# Patient Record
Sex: Female | Born: 1950 | Race: White | Hispanic: No | Marital: Married | Smoking: Never smoker
Health system: Southern US, Community
[De-identification: ages and names within clinical notes are randomized; demographics above are authoritative.]

## PROBLEM LIST (undated history)

## (undated) DIAGNOSIS — E785 Hyperlipidemia, unspecified: Secondary | ICD-10-CM

## (undated) DIAGNOSIS — E119 Type 2 diabetes mellitus without complications: Secondary | ICD-10-CM

## (undated) DIAGNOSIS — I10 Essential (primary) hypertension: Secondary | ICD-10-CM

## (undated) DIAGNOSIS — F419 Anxiety disorder, unspecified: Secondary | ICD-10-CM

## (undated) HISTORY — DX: Type 2 diabetes mellitus without complications: E11.9

## (undated) HISTORY — DX: Hyperlipidemia, unspecified: E78.5

## (undated) HISTORY — PX: ABDOMINAL HYSTERECTOMY: SHX81

## (undated) HISTORY — DX: Essential (primary) hypertension: I10

## (undated) HISTORY — DX: Anxiety disorder, unspecified: F41.9

## (undated) HISTORY — PX: SPINE SURGERY: SHX786

---

## 1998-09-25 ENCOUNTER — Emergency Department (HOSPITAL_COMMUNITY): Admission: EM | Admit: 1998-09-25 | Discharge: 1998-09-25 | Payer: Self-pay | Admitting: Emergency Medicine

## 1999-04-02 ENCOUNTER — Other Ambulatory Visit: Admission: RE | Admit: 1999-04-02 | Discharge: 1999-04-02 | Payer: Self-pay | Admitting: Obstetrics & Gynecology

## 1999-04-26 ENCOUNTER — Emergency Department (HOSPITAL_COMMUNITY): Admission: EM | Admit: 1999-04-26 | Discharge: 1999-04-26 | Payer: Self-pay | Admitting: Emergency Medicine

## 1999-06-03 ENCOUNTER — Ambulatory Visit (HOSPITAL_COMMUNITY): Admission: RE | Admit: 1999-06-03 | Discharge: 1999-06-03 | Payer: Self-pay | Admitting: *Deleted

## 1999-08-26 ENCOUNTER — Encounter: Payer: Self-pay | Admitting: Emergency Medicine

## 1999-08-26 ENCOUNTER — Encounter: Admission: RE | Admit: 1999-08-26 | Discharge: 1999-08-26 | Payer: Self-pay | Admitting: Emergency Medicine

## 1999-10-06 ENCOUNTER — Encounter: Payer: Self-pay | Admitting: Emergency Medicine

## 1999-10-06 ENCOUNTER — Encounter: Admission: RE | Admit: 1999-10-06 | Discharge: 1999-10-06 | Payer: Self-pay | Admitting: Emergency Medicine

## 2000-05-28 ENCOUNTER — Other Ambulatory Visit: Admission: RE | Admit: 2000-05-28 | Discharge: 2000-05-28 | Payer: Self-pay | Admitting: Obstetrics & Gynecology

## 2000-07-28 ENCOUNTER — Ambulatory Visit (HOSPITAL_COMMUNITY): Admission: RE | Admit: 2000-07-28 | Discharge: 2000-07-28 | Payer: Self-pay | Admitting: Obstetrics & Gynecology

## 2000-07-28 ENCOUNTER — Encounter: Payer: Self-pay | Admitting: Obstetrics & Gynecology

## 2001-05-25 ENCOUNTER — Other Ambulatory Visit: Admission: RE | Admit: 2001-05-25 | Discharge: 2001-05-25 | Payer: Self-pay | Admitting: Obstetrics & Gynecology

## 2002-03-29 ENCOUNTER — Encounter: Payer: Self-pay | Admitting: Obstetrics & Gynecology

## 2002-03-29 ENCOUNTER — Ambulatory Visit (HOSPITAL_COMMUNITY): Admission: RE | Admit: 2002-03-29 | Discharge: 2002-03-29 | Payer: Self-pay | Admitting: Obstetrics & Gynecology

## 2002-05-26 ENCOUNTER — Other Ambulatory Visit: Admission: RE | Admit: 2002-05-26 | Discharge: 2002-05-26 | Payer: Self-pay | Admitting: Obstetrics & Gynecology

## 2003-10-17 ENCOUNTER — Inpatient Hospital Stay (HOSPITAL_COMMUNITY): Admission: EM | Admit: 2003-10-17 | Discharge: 2003-10-18 | Payer: Self-pay | Admitting: Emergency Medicine

## 2004-03-04 ENCOUNTER — Emergency Department (HOSPITAL_COMMUNITY): Admission: AC | Admit: 2004-03-04 | Discharge: 2004-03-04 | Payer: Self-pay

## 2004-03-06 ENCOUNTER — Encounter: Admission: RE | Admit: 2004-03-06 | Discharge: 2004-03-06 | Payer: Self-pay | Admitting: Emergency Medicine

## 2004-03-18 ENCOUNTER — Emergency Department (HOSPITAL_COMMUNITY): Admission: EM | Admit: 2004-03-18 | Discharge: 2004-03-19 | Payer: Self-pay | Admitting: Emergency Medicine

## 2004-04-14 ENCOUNTER — Emergency Department (HOSPITAL_COMMUNITY): Admission: EM | Admit: 2004-04-14 | Discharge: 2004-04-14 | Payer: Self-pay | Admitting: Emergency Medicine

## 2004-04-15 ENCOUNTER — Emergency Department (HOSPITAL_COMMUNITY): Admission: EM | Admit: 2004-04-15 | Discharge: 2004-04-15 | Payer: Self-pay | Admitting: Emergency Medicine
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
## 2005-08-27 ENCOUNTER — Encounter: Admission: RE | Admit: 2005-08-27 | Discharge: 2005-08-27 | Payer: Self-pay | Admitting: Emergency Medicine

## 2005-09-10 ENCOUNTER — Encounter: Admission: RE | Admit: 2005-09-10 | Discharge: 2005-09-10 | Payer: Self-pay | Admitting: Emergency Medicine

## 2005-09-24 ENCOUNTER — Encounter: Admission: RE | Admit: 2005-09-24 | Discharge: 2005-09-24 | Payer: Self-pay | Admitting: Emergency Medicine

## 2005-10-05 ENCOUNTER — Encounter: Admission: RE | Admit: 2005-10-05 | Discharge: 2005-10-05 | Payer: Self-pay | Admitting: Emergency Medicine

## 2006-04-07 ENCOUNTER — Encounter: Admission: RE | Admit: 2006-04-07 | Discharge: 2006-04-07 | Payer: Self-pay | Admitting: Emergency Medicine

## 2006-04-22 ENCOUNTER — Encounter: Admission: RE | Admit: 2006-04-22 | Discharge: 2006-04-22 | Payer: Self-pay | Admitting: Emergency Medicine

## 2006-05-13 ENCOUNTER — Encounter: Admission: RE | Admit: 2006-05-13 | Discharge: 2006-05-13 | Payer: Self-pay | Admitting: Emergency Medicine

## 2006-12-05 ENCOUNTER — Encounter: Admission: RE | Admit: 2006-12-05 | Discharge: 2006-12-05 | Payer: Self-pay | Admitting: Emergency Medicine

## 2007-04-04 ENCOUNTER — Ambulatory Visit (HOSPITAL_COMMUNITY): Admission: RE | Admit: 2007-04-04 | Discharge: 2007-04-04 | Payer: Self-pay | Admitting: Obstetrics & Gynecology

## 2007-05-14 ENCOUNTER — Encounter: Admission: RE | Admit: 2007-05-14 | Discharge: 2007-05-14 | Payer: Self-pay | Admitting: Emergency Medicine

## 2007-06-13 ENCOUNTER — Observation Stay (HOSPITAL_COMMUNITY): Admission: RE | Admit: 2007-06-13 | Discharge: 2007-06-14 | Payer: Self-pay | Admitting: Neurosurgery

## 2007-08-04 ENCOUNTER — Encounter: Admission: RE | Admit: 2007-08-04 | Discharge: 2007-08-04 | Payer: Self-pay | Admitting: Neurosurgery

## 2008-01-28 ENCOUNTER — Encounter: Admission: RE | Admit: 2008-01-28 | Discharge: 2008-01-28 | Payer: Self-pay | Admitting: Neurosurgery

## 2008-09-07 IMAGING — RF DG CERVICAL SPINE 1V
1 series · 1 of 1 positions shown · non-contrast
Comparison: none

CLINICAL DATA: 55 year old female; status post C5-6 ACDF.
 CERVICAL SPINE ? SINGLE VIEW ? 06/13/07:

[Series 1: run · 1 of 1 slices shown]
[im 1/1]
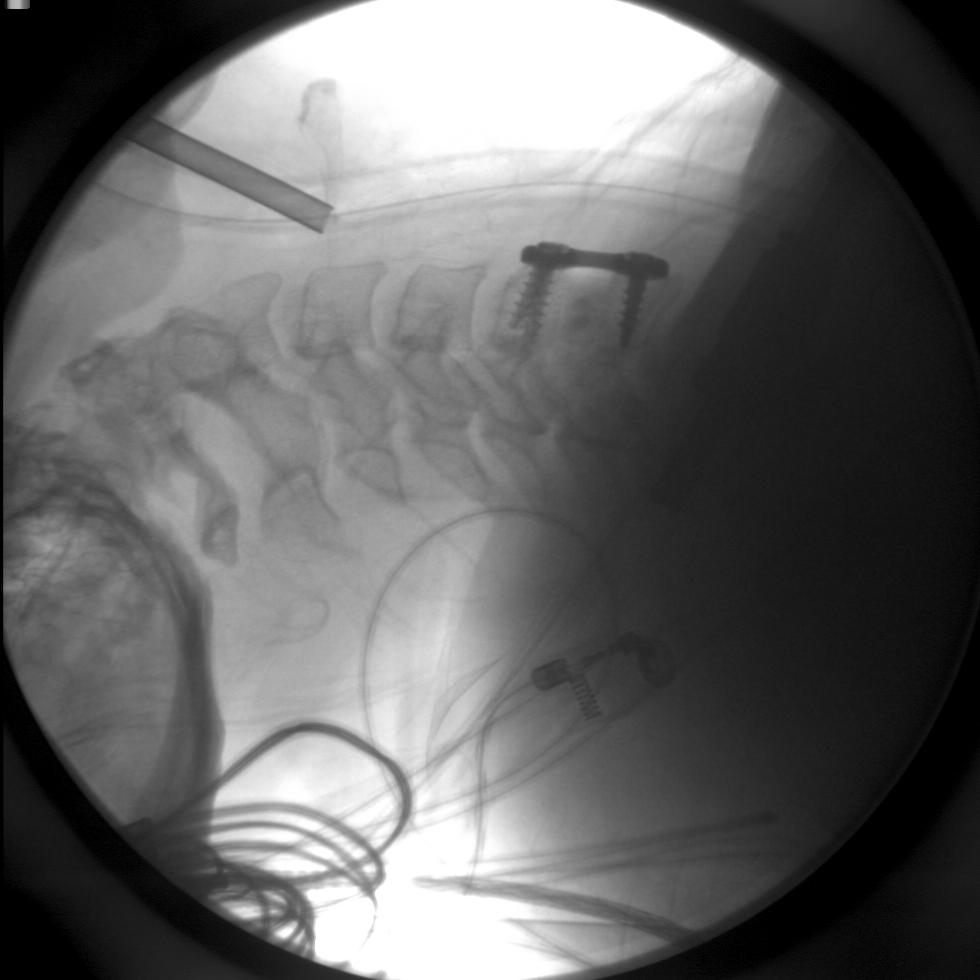

[1 of 1 positions shown; findings below may reference images not displayed]

FINDINGS: Intraoperative view of the cervical spine demonstrates the patient is status post ACDF at C5-6.  Alignment is anatomic.   The patient is intubated.  A metallic cannula is presumably extraneous to the patient with the tip at the level of C3.
IMPRESSION: Status post ACDF C5-6 without radiographic evidence for complication.

## 2009-02-23 ENCOUNTER — Encounter: Admission: RE | Admit: 2009-02-23 | Discharge: 2009-02-23 | Payer: Self-pay | Admitting: Neurosurgery

## 2009-04-29 ENCOUNTER — Ambulatory Visit (HOSPITAL_COMMUNITY): Admission: RE | Admit: 2009-04-29 | Discharge: 2009-04-30 | Payer: Self-pay | Admitting: Neurosurgery

## 2009-04-29 ENCOUNTER — Encounter: Admission: RE | Admit: 2009-04-29 | Discharge: 2009-04-29 | Payer: Self-pay | Admitting: Family Medicine

## 2009-12-05 ENCOUNTER — Ambulatory Visit (HOSPITAL_COMMUNITY): Admission: RE | Admit: 2009-12-05 | Discharge: 2009-12-05 | Payer: Self-pay | Admitting: Gastroenterology

## 2010-06-07 ENCOUNTER — Encounter: Admission: RE | Admit: 2010-06-07 | Discharge: 2010-06-07 | Payer: Self-pay | Admitting: Neurosurgery

## 2010-07-25 IMAGING — CR DG THORACOLUMBAR SPINE 2V
1 series · 1 of 1 positions shown · non-contrast
Comparison: 02/23/2009

CLINICAL DATA: T12-L1 transpedicular microdiskectomy

THORACOLUMBAR SPINE - 2 VIEW

[view not recorded]
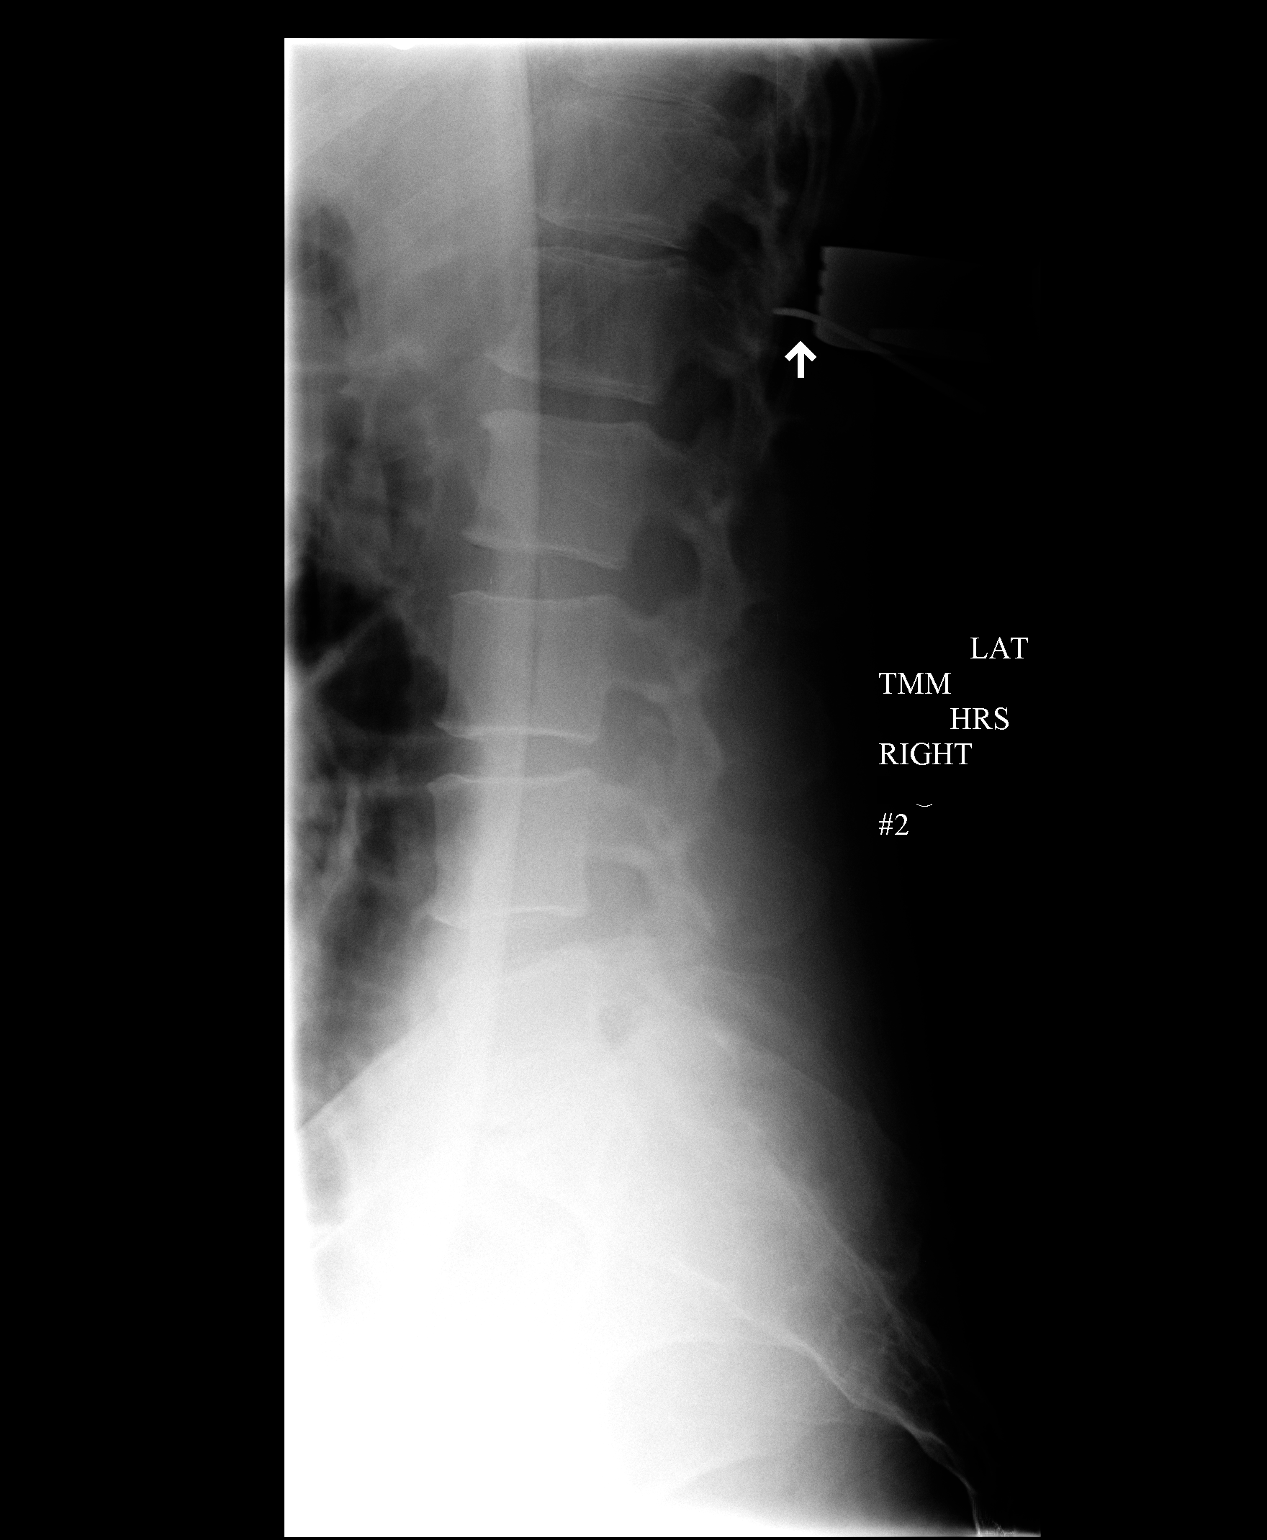

[1 of 1 positions shown; findings below may reference images not displayed]

FINDINGS: An initial film shows a needle between the spinous
processes of L1 and L2.  The second film shows a probe between the
spinous processes of T12 and L1.
IMPRESSION: T12-L1 localized

## 2011-01-03 LAB — GLUCOSE, CAPILLARY

## 2011-01-04 LAB — CBC
MCV: 93.1 fL (ref 78.0–100.0)
Platelets: 168 10*3/uL (ref 150–400)
RDW: 12.8 % (ref 11.5–15.5)
WBC: 8.8 10*3/uL (ref 4.0–10.5)

## 2011-01-04 LAB — TYPE AND SCREEN
ABO/RH(D): A POS
Antibody Screen: NEGATIVE

## 2011-01-04 LAB — DIFFERENTIAL
Basophils Absolute: 0 10*3/uL (ref 0.0–0.1)
Eosinophils Absolute: 0.1 10*3/uL (ref 0.0–0.7)
Lymphocytes Relative: 19 % (ref 12–46)
Lymphs Abs: 1.6 10*3/uL (ref 0.7–4.0)
Neutrophils Relative %: 75 % (ref 43–77)

## 2011-01-04 LAB — BASIC METABOLIC PANEL
BUN: 8 mg/dL (ref 6–23)
Creatinine, Ser: 0.83 mg/dL (ref 0.4–1.2)
GFR calc non Af Amer: >60 mL/min (ref >60)

## 2011-02-10 NOTE — Op Note (Signed)
NAMEROSMARY, Jessica Moss               ACCOUNT NO.:  [id number]   MEDICAL RECORD NO.:  [id number]          PATIENT TYPE:  INP   LOCATION:  3172                         FACILITY:  [facility]   PHYSICIAN:  Kathaleen Maser. Pool, M.D.    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   PROCEDURE NOTE:  C5-6 anterior cervical diskectomy and fusion with  allograft anterior plating.   SURGEON:  Kathaleen Maser. Pool, M.D.   ASSISTANT:  Donalee Citrin, M.D.   ANESTHESIA:  General orotracheal anesthesia.   PREMEDICATION:  The patient is a 60 year old female with history of neck  and bilateral upper extremity symptoms consistent with C6  radiculopathies.  Workup has demonstrated evidence of marked disk  degeneration with associated spondylosis and stenosis causing marked  compression of the exiting C6 nerve roots bilaterally as well as  significant central canal stenosis.  The patient presents now for C5-6  anterior cervical diskectomy and fusion with allograft anterior plating  in hopes of improving her symptoms.   DESCRIPTION OF PROCEDURE:  Patient taken to the operating room and  placed on the table in supine position.  After adequate level of  anesthesia was achieved, the patient positioned supine with neck  slightly __________  held in place with halter traction.  The patient's  anterior cervical regions prepped and sterilely.  A 10 blade was used to  make a linear skin incision overlying the C5-6 interspace.  The incision  was carried down to the platysma.  Platysma was then divided vertically  and dissection begun on the medial border of the sternocleidomastoid  muscle and carotid sheath.  Trachea and esophagus mobilized and  retracted towards the left.  Prevertebral fascia stripped off the  anterior spinal  column.  Longus coli was elevated bilaterally using  electrocautery.  Deep self-retaining retractor was placed.  Intraoperative fluoroscopy was used and C5-6 level was confirmed.  Disk  space incised with a 15 blade in a rectangular fracture.  Wide disk  space clean-out was then achieved using pituitary rongeurs, forward and  back-biting Carlen curettes, Kerrison rongeurs, high-speed drill.  All  elements of the disk were removed down to the level of the posterior  annulus.  Microscope was then brought into the field and used throughout  the remainder of the diskectomy.  Remaining aspects of the annulus and  osteophytes were removed using high-speed drill down to the level of the  posterior longitudinal ligament.  Posterior longitudinal ligament was  then elevated and resected in piecemeal fashion using Kerrison rongeurs.  Underlying thecal sac was then identified.  Wide central decompression  was then performed by undercutting the bodies of C5 and C6.  Decompression then proceeded out each neural foramen.  Wide anterior  foraminotomies were then performed along the course of the exiting C6  nerve roots bilaterally.  At this point, a very thorough decompression  had been achieved.  A 6 mm  LifeNet allograft wedge was then impacted  into place and recessed nicely 1 mL anterior cortical margin.  A 25 mm  Atlantis anterior cervical plate was then placed over the C5-C6 levels.  This was then attached under fluoroscopic guidance using 13 mm variable  angle screws, two each at both levels.  All four screws given final  tightening and found to be solidly within bone.  Locking screws engaged  at both levels.  Final images revealed good position of bone grafts and  hardware, proper operative  level and normal alignment of spine.  The wound was checked for  hemostasis and found to be good.  It was then closed in typical fashion.  Steri-Strips and sterile dressings were applied.  There were no  apparent  complications.  The patient tolerated the procedure well and she returns  to the recovery room postoperatively.           ______________________________  Kathaleen Maser Pool, M.D.     HAP/MEDQ  D:  06/13/2007  T:  06/13/2007  Job:  [id number]

## 2011-02-10 NOTE — Op Note (Signed)
Jessica Moss, Jessica Moss               ACCOUNT NO.:  [id number]   MEDICAL RECORD NO.:  [id number]          PATIENT TYPE:  OIB   LOCATION:  3533                         FACILITY:  [facility]   PHYSICIAN:  Kathaleen Maser. Pool, M.D.    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   PROCEDURE:  Left T12-L1 transpedicular microdiskectomy.   SURGEON:  Kathaleen Maser. Pool, MD   ASSISTANT:  Donalee Citrin, MD   ANESTHESIA:  General endotracheal.   INDICATIONS:  Jessica Moss is a 60 year old female with a history of  back and bilateral lower extremity pain failing conservative management.  Workup demonstrates evidence of T12-L1 disk herniation with spinal cord  compression and a resultant lower thoracolumbar syrinx.  The patient  presents now for transpedicular microdiskectomy in hopes of improving  her symptoms.   OPERATION:  The patient was brought to the operating room, placed on  operating table in supine position.  After adequal te level of  anesthesia was achieved, the patient placed prone onto Wilson frame,  appropriately padded the patient's lumbar region and prepped and draped  sterilely.  A #10 blade was used to make a curvilinear skin incision  overlying the  T12-L1 level.  This was carried down sharply.  A midline  and subperiosteal dissection was then performed exposing lamina and  facet joints of T12 and L1 on the left side.  A deep self-retaining  retractor was placed.  Intraoperative x-rays taken and the level was  confirmed.  Laminotomy was then performed.  Using a high-speed drill and  Kerrison rongeurs, the inferior aspect lamina of T12, medial aspect of  T12-L1 facet joint complex, and the superior aspect of the L1 lamina,  superior aspect  of the L1 pedicle was also removed using a high-speed  drill.  Ligamentum flavum was then elevated and resected in piecemeal  fashion using Kerrison rongeurs.  The underlying thecal sac and exiting  L1 nerve root were identified.  Microscope was then brought to the field  for microdissection.  Disk space and disk herniation were then  identified.  Disk space was then incised with a 15 blade while  performing very gentle traction upon the lateral aspect of the thecal  sac.  Disk space was then entered.  An aggressive intradiskal diskectomy  was then performed.  Disk material was then pushed into the disk space  including all aspects of the disk herniation.  This was then removed  using pituitary rongeurs.  At this point, a very thorough decompression  was achieved.  There was no injury to thecal sac or nerve roots.  Wound  was then irrigated with antibiotic solution.  Gelfoam was placed  topically.  Hemostasis was found to be good.  Microscope and retractors  were removed and hemostasis was then achieved with electrocautery.  Wounds were then closed in layers with Vicryl suture.  Steri-Strips and  sterile dressings were applied.  There were no complications.  The  patient tolerated the procedure well and she returned to the recovery  room postoperatively.           ______________________________  Kathaleen Maser Pool, M.D.     HAP/MEDQ  D:  04/29/2009  T:  04/30/2009  Job:  [id number]
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
## 2011-02-13 NOTE — H&P (Signed)
NAME:  Jessica, Moss                           ACCOUNT NO.:  [id number]   MEDICAL RECORD NO.:  [id number]                   PATIENT TYPE:  EMS   LOCATION:  [facility]                                 FACILITY:  [facility]   PHYSICIAN:  Ara D. Metjian, M.D.                DATE OF BIRTH:  1951-03-08   DATE OF ADMISSION:  10/17/2003  DATE OF DISCHARGE:                                HISTORY & PHYSICAL   CHIEF COMPLAINT:  Angioedema.   HISTORY OF PRESENT ILLNESS:  The patient is a very pleasant 60 year old  white female with a past medical history significant for angioedema for the  past 10 years who has had infrequent episodes over the past five years of  angioedema.  She states that this morning she woke approximately 8:30 and  felt a dry and scratchy throat.  It felt as though this might be the  beginning of an angioedema attack.  Her symptoms progressed until  approximately 10 o'clock she began to have significant laryngeal and  pharyngeal involvement of angioedema.  The patient was brought to the  emergency room.  Here, she emergently received methylprednisolone,  diphenhydramine, famotidine and subcu epinephrine with prompt resolution of  her symptoms.  Currently, the patient feels some residual throat edema but  is feeling significantly improved.   ALLERGIES:  1. FENTANYL.  2. PENICILLIN.  3. SULFA.   MEDICATIONS:  1. Doxepin 75 mg p.o. q.h.s.  2. Chlorpheniramine 8 mg p.o. q.h.s..  3. Glipizide XL 10 mg p.o. daily.  4. Diphenhydramine p.r.n.   PAST MEDICAL HISTORY:  1. Recurrent angioedema, since 1995.     a. Received evaluation at [facility], thought not to be hereditary        angioedema.  2. Diabetes mellitus type 2.  3. Hyperlipidemia.   PAST SURGICAL HISTORY:  1. Status post C-section 64.  2. Status post TAH in 1992.   SOCIAL HISTORY:  The patient denies tobacco, alcohol, illicit or IV drug  use.  She is a former Nurse, learning disability at [employer].  She lives in [place]  with her husband; they have one dog and they have city water.  She currently  works for an Technical sales engineer firm, having left nursing.   FAMILY MEDICAL HISTORY:  Mother alive and healthy, age 61.  Father deceased,  age 15, from an MI.  Two brothers, aged 68 and 47, and one sister alive, age  30, both of whom are overweight and have seasonal allergies.  The patient  denies any history of angioedema in the family.  She states that they are of  [place] descent.   REVIEW OF SYSTEMS:  The patient admits to sign of congestion and abdominal  pain.  She denies headaches, alopecia, visual acuity changes, scotoma,  diplopia, scleral icterus, auditory changes, vertigo, epistaxis, oral  lesions or ulcers, dysphagia, odynophagia, shortness of breath at rest or  upon exertion, orthopnea,  PND, hemoptysis, exposure to people with  tuberculosis, cough or wheeze, chest pain at rest or upon exertion, nausea,  vomiting, diarrhea, bright red blood per rectum, melena, hematemesis, pencil-  thin stools, dysuria, polyuria, hematuria, myalgias, arthralgias, fevers,  chills, sweats, skin rashes, facial, arm, or leg weakness or numbness.  She  denies any genital ulcers or lesions.  She denies any symptoms consistent  with pathergy.   PHYSICAL EXAMINATION:  VITAL SIGNS:  Temperature 98.6, pulse 134,  respirations 24, BP 149/89.  Pulse ox is 97% on room air.  GENERAL:  Well-developed, well-nourished, a 60 year old white female  currently in no apparent distress.  HEENT:  Head normocephalic, atraumatic, without alopecia.  Eyes:  Pupils  equally round and reactive to light.  Extraocular muscles are intact.  Anicteric.  No injection, no discharge.  Normal appearing conjunctivae.  Ears:  TMs clear bilaterally.  Nose:  No dried blood in the nares.  Mouth:  Moist mucous membranes.  Uvula midline.  Oropharynx without erythema or  exudate.  Dentition in moderate repair.  There are no oral lesions or   ulcers.  NECK:  Supple without meningeal signs.  There is no lymphadenopathy,  thyromegaly, bruit, or JVD.  LUNGS:  Clear to auscultation and percussion bilaterally, without rales,  rhonchi, or wheezes.  HEART:  Tachycardic but regular rate and rhythm, S1 and S2.  ABDOMEN:  Obese, nondistended.  Bowel sounds present.  Nontender.  No  guarding or rebound.  No hepatosplenomegaly.  EXTREMITIES:  No clubbing, cyanosis, or edema.  DERM:  There is no purpura or petechiae, no rashes appreciated.  NEURO:  Cranial nerves II-XII grossly intact.  Strength is 5/5 in all  extremities and axial groups.  There appears to be no sensory deficits.   LABORATORY DATA:  White blood cells 11.2, hemoglobin 15.7, hematocrit 44.9  with a platelet count of 209,000.  There are 0% eosinophils.  Sodium 137,  potassium 4.2, chloride 101, carbon dioxide 24, glucose 385, BUN 9,  creatinine 0.9, total bilirubin 0.9, alkaline phosphatase 70, AST 30, ALT  37, total protein 6.6, albumin 3.9, calcium 9.0.   ASSESSMENT/PLAN:  A 60 year old white female with recurrent angioedema.   Angioedema:  The patient has had what appears to be a workup in the past to  find the cause of her angioedema.  The differential includes hereditary  angioedema with either inherited or acquired C1 esterase inhibitor  deficiency.  Other possibilities include sending a beta tryptase level,  checking C2 and C4 as possible workups for angioedema.  She denies any  current use of aspirin or ACE inhibitor use, and these will be avoided  during this admission.  For now, will continue the patient on 75 mg of  doxepin q.h.s.  Will write her for a flair of steroids; she has already  received  125 mg of methylprednisolone in the ER, and will start with 50 mg p.o. of  prednisone and rapidly taper her down.  We will start with famotidine 20 mg  p.o. b.i.d., continue with the chlorpheniramine 8 mg p.o. q.h.s.,and give another dose of diphenhydramine at 50 mg  tonight and 50 mg p.o. q.4h. p.r.n.                                                Ara D. Tammi Klippel, M.D.    ADM/MEDQ  D:  10/17/2003  T:  10/17/2003  Job:  [id number]   cc:   Reuben Likes, M.D.  [address]  [place]  [place] [address]  Fax: [phone]

## 2011-02-13 NOTE — Discharge Summary (Signed)
NAME:  Jessica Moss, Jessica Moss                           ACCOUNT NO.:  [id number]   MEDICAL RECORD NO.:  [id number]                   PATIENT TYPE:  INP   LOCATION:  4731                                 FACILITY:  [facility]   PHYSICIAN:  Ara D. Tammi Klippel, M.D.                DATE OF BIRTH:  Dec 08, 1950   DATE OF ADMISSION:  10/17/2003  DATE OF DISCHARGE:  10/18/2003                                 DISCHARGE SUMMARY   PRIMARY CARE PHYSICIAN:  Dr. Reuben Likes.                                                Ara D. Tammi Klippel, M.D.    ADM/MEDQ  D:  11/05/2003  T:  11/06/2003  Job:  [id number]   cc:   Reuben Likes, M.D.  [address]  [place]  [place] [address]  Fax: [phone]

## 2011-06-10 ENCOUNTER — Other Ambulatory Visit: Payer: Self-pay | Admitting: Family Medicine

## 2011-06-10 DIAGNOSIS — Z1231 Encounter for screening mammogram for malignant neoplasm of breast: Secondary | ICD-10-CM

## 2011-06-17 ENCOUNTER — Ambulatory Visit
Admission: RE | Admit: 2011-06-17 | Discharge: 2011-06-17 | Disposition: A | Discharge status: Home / Self Care | Payer: 59 | Source: Ambulatory Visit | Attending: Family Medicine | Admitting: Family Medicine

## 2011-06-17 DIAGNOSIS — Z1231 Encounter for screening mammogram for malignant neoplasm of breast: Secondary | ICD-10-CM

## 2011-07-10 LAB — DIFFERENTIAL
Eosinophils Absolute: 0.1
Eosinophils Relative: 1
Lymphocytes Relative: 23
Lymphs Abs: 1.7
Monocytes Absolute: 0.4
Monocytes Relative: 5

## 2011-07-10 LAB — CBC
MCHC: 34.1
Platelets: 194
RBC: 4.55

## 2011-07-10 LAB — COMPREHENSIVE METABOLIC PANEL
ALT: 26
AST: 22
Albumin: 3.8
CO2: 26
Calcium: 9
GFR calc Af Amer: >60
GFR calc non Af Amer: >60
Sodium: 141
Total Protein: 6

## 2011-07-10 LAB — LIPID PANEL
HDL: 48
Total CHOL/HDL Ratio: 4.4
VLDL: 35

## 2011-07-10 LAB — TYPE AND SCREEN
ABO/RH(D): A POS
Antibody Screen: NEGATIVE

## 2012-03-09 ENCOUNTER — Ambulatory Visit
Admission: RE | Admit: 2012-03-09 | Discharge: 2012-03-09 | Disposition: A | Discharge status: Home / Self Care | Payer: 59 | Source: Ambulatory Visit | Attending: Family Medicine | Admitting: Family Medicine

## 2012-03-09 ENCOUNTER — Other Ambulatory Visit: Payer: Self-pay | Admitting: Family Medicine

## 2012-03-09 DIAGNOSIS — R52 Pain, unspecified: Secondary | ICD-10-CM

## 2012-06-28 ENCOUNTER — Other Ambulatory Visit: Payer: Self-pay | Admitting: Family Medicine

## 2012-06-28 DIAGNOSIS — Z1231 Encounter for screening mammogram for malignant neoplasm of breast: Secondary | ICD-10-CM

## 2012-06-29 ENCOUNTER — Other Ambulatory Visit: Payer: Self-pay | Admitting: Family Medicine

## 2012-06-29 DIAGNOSIS — Z780 Asymptomatic menopausal state: Secondary | ICD-10-CM

## 2012-07-15 ENCOUNTER — Ambulatory Visit: Payer: 59

## 2012-07-28 ENCOUNTER — Ambulatory Visit
Admission: RE | Admit: 2012-07-28 | Discharge: 2012-07-28 | Disposition: A | Discharge status: Home / Self Care | Payer: 59 | Source: Ambulatory Visit | Attending: Family Medicine | Admitting: Family Medicine

## 2012-07-28 DIAGNOSIS — Z780 Asymptomatic menopausal state: Secondary | ICD-10-CM

## 2012-07-28 DIAGNOSIS — Z1231 Encounter for screening mammogram for malignant neoplasm of breast: Secondary | ICD-10-CM

## 2012-07-29 ENCOUNTER — Other Ambulatory Visit: Payer: Self-pay | Admitting: Family Medicine

## 2012-07-29 DIAGNOSIS — R928 Other abnormal and inconclusive findings on diagnostic imaging of breast: Secondary | ICD-10-CM

## 2012-08-09 ENCOUNTER — Other Ambulatory Visit: Payer: Self-pay | Admitting: Family Medicine

## 2012-08-09 ENCOUNTER — Ambulatory Visit
Admission: RE | Admit: 2012-08-09 | Discharge: 2012-08-09 | Disposition: A | Discharge status: Home / Self Care | Payer: 59 | Source: Ambulatory Visit | Attending: Family Medicine | Admitting: Family Medicine

## 2012-08-09 DIAGNOSIS — R928 Other abnormal and inconclusive findings on diagnostic imaging of breast: Secondary | ICD-10-CM

## 2013-05-22 ENCOUNTER — Other Ambulatory Visit: Payer: Self-pay | Admitting: Family Medicine

## 2013-05-22 DIAGNOSIS — R921 Mammographic calcification found on diagnostic imaging of breast: Secondary | ICD-10-CM

## 2013-07-31 ENCOUNTER — Other Ambulatory Visit: Payer: Self-pay | Admitting: Family Medicine

## 2013-07-31 ENCOUNTER — Ambulatory Visit
Admission: RE | Admit: 2013-07-31 | Discharge: 2013-07-31 | Disposition: A | Discharge status: Home / Self Care | Payer: BC Managed Care – PPO | Source: Ambulatory Visit | Attending: Family Medicine | Admitting: Family Medicine

## 2013-07-31 DIAGNOSIS — R921 Mammographic calcification found on diagnostic imaging of breast: Secondary | ICD-10-CM

## 2013-08-07 ENCOUNTER — Ambulatory Visit
Admission: RE | Admit: 2013-08-07 | Discharge: 2013-08-07 | Disposition: A | Discharge status: Home / Self Care | Payer: BC Managed Care – PPO | Source: Ambulatory Visit | Attending: Family Medicine | Admitting: Family Medicine

## 2013-08-07 DIAGNOSIS — R921 Mammographic calcification found on diagnostic imaging of breast: Secondary | ICD-10-CM

## 2014-07-11 ENCOUNTER — Other Ambulatory Visit: Payer: Self-pay

## 2014-07-11 DIAGNOSIS — Z1239 Encounter for other screening for malignant neoplasm of breast: Secondary | ICD-10-CM

## 2014-07-18 ENCOUNTER — Other Ambulatory Visit: Payer: Self-pay

## 2014-07-18 DIAGNOSIS — Z1231 Encounter for screening mammogram for malignant neoplasm of breast: Secondary | ICD-10-CM

## 2014-07-20 ENCOUNTER — Ambulatory Visit
Admission: RE | Admit: 2014-07-20 | Discharge: 2014-07-20 | Disposition: A | Discharge status: Home / Self Care | Payer: BC Managed Care – PPO | Source: Ambulatory Visit

## 2014-07-20 DIAGNOSIS — Z1231 Encounter for screening mammogram for malignant neoplasm of breast: Secondary | ICD-10-CM

## 2015-06-14 ENCOUNTER — Other Ambulatory Visit: Payer: Self-pay

## 2015-06-14 DIAGNOSIS — Z1231 Encounter for screening mammogram for malignant neoplasm of breast: Secondary | ICD-10-CM

## 2015-06-27 ENCOUNTER — Other Ambulatory Visit: Payer: Self-pay | Admitting: Family Medicine

## 2015-06-27 DIAGNOSIS — M8580 Other specified disorders of bone density and structure, unspecified site: Secondary | ICD-10-CM

## 2015-07-22 ENCOUNTER — Ambulatory Visit: Payer: Self-pay

## 2015-07-26 ENCOUNTER — Ambulatory Visit
Admission: RE | Admit: 2015-07-26 | Discharge: 2015-07-26 | Disposition: A | Discharge status: Home / Self Care | Payer: 59 | Source: Ambulatory Visit | Attending: Family Medicine | Admitting: Family Medicine

## 2015-07-26 ENCOUNTER — Ambulatory Visit
Admission: RE | Admit: 2015-07-26 | Discharge: 2015-07-26 | Disposition: A | Discharge status: Home / Self Care | Payer: 59 | Source: Ambulatory Visit

## 2015-07-26 DIAGNOSIS — Z1231 Encounter for screening mammogram for malignant neoplasm of breast: Secondary | ICD-10-CM

## 2015-07-26 DIAGNOSIS — M8580 Other specified disorders of bone density and structure, unspecified site: Secondary | ICD-10-CM

## 2015-08-07 ENCOUNTER — Ambulatory Visit: Payer: 59 | Attending: Family Medicine | Admitting: Physical Therapy

## 2015-08-07 ENCOUNTER — Encounter: Payer: Self-pay | Admitting: Physical Therapy

## 2015-08-07 DIAGNOSIS — M545 Low back pain: Secondary | ICD-10-CM | POA: Diagnosis present

## 2015-08-07 DIAGNOSIS — M79605 Pain in left leg: Secondary | ICD-10-CM
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
## 2015-08-12 ENCOUNTER — Ambulatory Visit: Payer: 59 | Admitting: Physical Therapy

## 2015-08-12 ENCOUNTER — Encounter: Payer: Self-pay | Admitting: Physical Therapy

## 2015-08-12 DIAGNOSIS — M545 Low back pain, unspecified: Secondary | ICD-10-CM

## 2015-08-12 NOTE — Therapy (Signed)
[facility] [address] [place], [place], [address] Phone: [phone]   Fax:  [phone]  Physical Therapy Treatment  Patient Details  Name: Jessica Moss MRN: [id number] Date of Birth: September 05, 1951 Referring Provider: Dr. Darcus Austin  Encounter Date: 08/12/2015      PT End of Session - 08/12/15 1411    Visit Number 2   Date for PT Re-Evaluation 10/02/15   PT Start Time 1400   PT Stop Time 1500   PT Time Calculation (min) 60 min   Activity Tolerance Patient tolerated treatment well   Behavior During Therapy Anxious      Past Medical History  Diagnosis Date  . Anxiety   . Diabetes mellitus without complication ([facility])   . Hyperlipidemia   . Hypertension     Past Surgical History  Procedure Laterality Date  . Abdominal hysterectomy    . Spine surgery      cervical and lumbar    There were no vitals filed for this visit.  Visit Diagnosis:  Lumbar pain with radiation down both legs      Subjective Assessment - 08/12/15 1408    Subjective I felt better after the heat and stim.    Limitations Sitting   How long can you sit comfortably? difficulty   Patient Stated Goals be able to function with managing her pain for cooking and walking   Currently in Pain? Yes   Pain Score 5    Pain Location Back   Pain Orientation Lower   Pain Descriptors / Indicators Constant;Tender;Sore;Numbness   Pain Type Chronic pain   Pain Onset More than a month ago   Pain Frequency Constant   Aggravating Factors  laying on the side; frustration, unable to sit for artwork, sitting   Pain Relieving Factors medication   Multiple Pain Sites No            [facility] PT Assessment - 08/12/15 0001    Posture/Postural Control   Posture Comments sitting posture,    Palpation   SI assessment  pelvis in correct alignment                     [facility] Adult PT Treatment/Exercise - 08/12/15 0001    Therapeutic  Activites    Therapeutic Activities Lifting;ADL's   ADL's standing for activites, sitting with lumbar roll   Lifting squat to lift but lost balance and regained it   Lumbar Exercises: Stretches   Active Hamstring Stretch 2 reps;30 seconds  bil.    Lower Trunk Rotation 2 reps;10 seconds  bil.    Piriformis Stretch 2 reps;30 seconds  bil. sitting   Lumbar Exercises: Aerobic   Elliptical level 1 x 5 min   Lumbar Exercises: Supine   Bent Knee Raise 10 reps;1 second  bil.    Bridge 10 reps   Other Supine Lumbar Exercises shoulder flexion 10x bil. with abdominal contraction   Modalities   Modalities Moist Heat;Electrical Stimulation   Moist Heat Therapy   Number Minutes Moist Heat 20 Minutes   Moist Heat Location Lumbar Spine  supine   Electrical Stimulation   Electrical Stimulation Location lumbar prone   Electrical Stimulation Action IFC   Electrical Stimulation Parameters to patient tolerance 20 min   Electrical Stimulation Goals Pain                PT Education - 08/12/15 1430    Education provided Yes   Education Details flexibility  exercises, back stabilization exercises   Person(s) Educated Patient   Methods Explanation;Demonstration;Handout   Comprehension Returned demonstration;Verbalized understanding          PT Short Term Goals - 08/12/15 1444    PT SHORT TERM GOAL #1   Title independent with initial HEP for flexibility   Time 4   Period Weeks   Status On-going  just learned   PT SHORT TERM GOAL #2   Title understand correct posture to decrease strain on her spine   Time 4   Period Weeks   Status On-going   PT SHORT TERM GOAL #3   Title understand how to perform abdominal massage to reduce constipation and strain on lumbar   Time 4   Period Weeks   Status On-going   PT SHORT TERM GOAL #4   Title understand how to toilet correctly while having a bowel movement to reduce strain on lumbar   Time 4   Period Weeks   Status New            PT Long Term Goals - 08/07/15 1204    PT LONG TERM GOAL #1   Title independent with HEP and understands how to progress herself   Time 8   Period Weeks   Status New   PT LONG TERM GOAL #2   Title understand correct body mechanics with daily tasks to reduce strain on lumbar spine   Time 8   Period Weeks   Status New   PT LONG TERM GOAL #3   Title understand ways to manage her back pain using a home TENS unit   Time 8   Period Weeks   Status New               Plan - 08/12/15 1442    Clinical Impression Statement Patient is a 64 year old female with diagnosis of chronic low back pain.  Patient reports she is better with stim and heat.  Patient is unsteady when squatting due to weak legs.  Patient has not met goals due to just starting therapy.  Patient will benefit from phsyical therapy to improve overall strength.    Pt will benefit from skilled therapeutic intervention in order to improve on the following deficits Decreased mobility;Decreased strength;Pain;Increased muscle spasms;Increased fascial restricitons;Decreased range of motion;Decreased endurance;Decreased activity tolerance   Rehab Potential Good   Clinical Impairments Affecting Rehab Potential None   PT Frequency 2x / week   PT Duration 8 weeks   PT Treatment/Interventions Cryotherapy;Electrical Stimulation;Moist Heat;Therapeutic exercise;Therapeutic activities;Ultrasound;Manual techniques;Patient/family education;Neuromuscular re-education;Passive range of motion;Other (comment)  Home TENS unit   PT Next Visit Plan body mechanics, abdominal massage, toileting technique, posture, soft tissue work   PT Home Exercise Plan body mechanics,    Consulted and Agree with Plan of Care Patient        Problem List There are no active problems to display for this patient.   Malinda Mayden,PT 08/12/2015, 2:45 PM  [facility] [address], [place],  [place], [address] Phone: [phone]   Fax:  [phone]  Name: Jessica Moss MRN: [id number] Date of Birth: Dec 05, 1950

## 2015-08-12 NOTE — Patient Instructions (Addendum)
Chair Sitting    Sit at edge of seat, spine straight, one leg extended and toe pointed to head. Put a hand on each thigh and bend forward from the hip, keeping spine straight. Allow hand on extended leg to reach toward toes. Support upper body with other arm. Hold 30___ seconds. Repeat __2_ times per session. Do _1__ sessions per day.  Copyright  VHI. All rights reserved.  Heat Therapy Heat therapy can help ease sore, stiff, injured, and tight muscles and joints. Heat relaxes your muscles, which may help ease your pain.  RISKS AND COMPLICATIONS If you have any of the following conditions, do not use heat therapy unless your health care provider has approved:  Poor circulation.  Healing wounds or scarred skin in the area being treated.  Diabetes, heart disease, or high blood pressure.  Not being able to feel (numbness) the area being treated.  Unusual swelling of the area being treated.  Active infections.  Blood clots.  Cancer.  Inability to communicate pain. This may include young children and people who have problems with their brain function (dementia).  Pregnancy. Heat therapy should only be used on old, pre-existing, or long-lasting (chronic) injuries. Do not use heat therapy on new injuries unless directed by your health care provider. HOW TO USE HEAT THERAPY There are several different kinds of heat therapy, including:  Moist heat pack.  Warm water bath.  Hot water bottle.  Electric heating pad.  Heated gel pack.  Heated wrap.  Electric heating pad. Use the heat therapy method suggested by your health care provider. Follow your health care provider's instructions on when and how to use heat therapy. GENERAL HEAT THERAPY RECOMMENDATIONS  Do not sleep while using heat therapy. Only use heat therapy while you are awake.  Your skin may turn pink while using heat therapy. Do not use heat therapy if your skin turns red.  Do not use heat therapy if you have  new pain.  High heat or long exposure to heat can cause burns. Be careful when using heat therapy to avoid burning your skin.  Do not use heat therapy on areas of your skin that are already irritated, such as with a rash or sunburn. SEEK MEDICAL CARE IF:  You have blisters, redness, swelling, or numbness.  You have new pain.  Your pain is worse. MAKE SURE YOU:  Understand these instructions.  Will watch your condition.  Will get help right away if you are not doing well or get worse.   This information is not intended to replace advice given to you by your health care provider. Make sure you discuss any questions you have with your health care provider.   Document Released: 12/07/2011 Document Revised: 10/05/2014 Document Reviewed: 11/07/2013 Elsevier Interactive Patient Education 2016 Elsevier Inc. Knee-to-Chest Stretch: Unilateral    With hand behind right knee, pull knee in to chest until a comfortable stretch is felt in lower back and buttocks. Keep back relaxed. Hold _30___ seconds. Repeat __2__ times per set. Do __1 sets per session. Do _1___ sessions per day.  http://orth.exer.us/126   Copyright  VHI. All rights reserved.  Lower Trunk Rotation Stretch    Keeping back flat and feet together, rotate knees to left side. Hold _15___ seconds. Repeat __2__ times per set. Do _1___ sets per session. Do _1___ sessions per day.  http://orth.exer.us/122   Copyright  VHI. All rights reserved.  Heat Therapy Heat therapy can help ease sore, stiff, injured, and tight muscles and joints.  Heat relaxes your muscles, which may help ease your pain.  RISKS AND COMPLICATIONS If you have any of the following conditions, do not use heat therapy unless your health care provider has approved:  Poor circulation.  Healing wounds or scarred skin in the area being treated.  Diabetes, heart disease, or high blood pressure.  Not being able to feel (numbness) the area being  treated.  Unusual swelling of the area being treated.  Active infections.  Blood clots.  Cancer.  Inability to communicate pain. This may include young children and people who have problems with their brain function (dementia).  Pregnancy. Heat therapy should only be used on old, pre-existing, or long-lasting (chronic) injuries. Do not use heat therapy on new injuries unless directed by your health care provider. HOW TO USE HEAT THERAPY There are several different kinds of heat therapy, including:  Moist heat pack.  Warm water bath.  Hot water bottle.  Electric heating pad.  Heated gel pack.  Heated wrap.  Electric heating pad. Use the heat therapy method suggested by your health care provider. Follow your health care provider's instructions on when and how to use heat therapy. GENERAL HEAT THERAPY RECOMMENDATIONS  Do not sleep while using heat therapy. Only use heat therapy while you are awake.  Your skin may turn pink while using heat therapy. Do not use heat therapy if your skin turns red.  Do not use heat therapy if you have new pain.  High heat or long exposure to heat can cause burns. Be careful when using heat therapy to avoid burning your skin.  Do not use heat therapy on areas of your skin that are already irritated, such as with a rash or sunburn. SEEK MEDICAL CARE IF:  You have blisters, redness, swelling, or numbness.  You have new pain.  Your pain is worse. MAKE SURE YOU:  Understand these instructions.  Will watch your condition.  Will get help right away if you are not doing well or get worse.   This information is not intended to replace advice given to you by your health care provider. Make sure you discuss any questions you have with your health care provider.   Document Released: 12/07/2011 Document Revised: 10/05/2014 Document Reviewed: 11/07/2013 Elsevier Interactive Patient Education 2016 [employer] Bridging    Slowly raise  buttocks from floor, keeping stomach tight. Hold 5 sec. Repeat _10___ times per set. Do __1__ sets per session. Do __1__ sessions per day.  http://orth.exer.us/1096   Copyright  VHI. All rights reserved.  Bent Leg Lift (Hook-Lying)    Tighten stomach and slowly raise right leg _6___ inches from floor. Keep trunk rigid. Hold _1___ seconds. Repeat __10__ times per set. Do __1__ sets per session. Do __1__ sessions per day.  http://orth.exer.us/1090   Copyright  VHI. All rights reserved.  Extremity Flexion (Hook-Lying)    Tighten stomach and slowly lower right arm over head until back begins to arch. Keep trunk rigid. Repeat _10___ times per set. Do __1__ sets per session. Do _1___ sessions per day.  http://orth.exer.us/1088   Copyright  VHI. All rights reserved.  [facility] [address], [address] [place], [place] [address] Phone # [phone] Fax [phone]

## 2015-08-19 ENCOUNTER — Encounter: Payer: Self-pay | Admitting: Physical Therapy

## 2015-08-19 ENCOUNTER — Ambulatory Visit: Payer: 59 | Admitting: Physical Therapy

## 2015-08-19 DIAGNOSIS — M79605 Pain in left leg: Secondary | ICD-10-CM

## 2015-08-19 DIAGNOSIS — M545 Low back pain: Secondary | ICD-10-CM | POA: Diagnosis not present

## 2015-08-19 NOTE — Therapy (Signed)
[facility] [address] [place], [place], [address] Phone: [phone]   Fax:  [phone]  Physical Therapy Treatment  Patient Details  Name: Jessica Moss MRN: [id number] Date of Birth: 01/15/1951 Referring Provider: Dr. Shaune Pollackonna Gates  Encounter Date: 08/19/2015      PT End of Session - 08/19/15 1137    Visit Number 3   Date for PT Re-Evaluation 10/02/15   PT Start Time 1102   PT Stop Time 1202   PT Time Calculation (min) 60 min   Activity Tolerance Patient tolerated treatment well   Behavior During Therapy Anxious      Past Medical History  Diagnosis Date  . Anxiety   . Diabetes mellitus without complication (HCC)   . Hyperlipidemia   . Hypertension     Past Surgical History  Procedure Laterality Date  . Abdominal hysterectomy    . Spine surgery      cervical and lumbar    There were no vitals filed for this visit.  Visit Diagnosis:  Lumbar pain with radiation down both legs      Subjective Assessment - 08/19/15 1117    Subjective Pt reports concerned about a varicouse vein on left prox calf, assest by PTA no concerns, reports no pain in low back and no radiating pain in legs.    Currently in Pain? No/denies   Multiple Pain Sites No                         [facility] Adult PT Treatment/Exercise - 08/19/15 0001    Posture/Postural Control   Posture/Postural Control Postural limitations   Postural Limitations Forward head;Rounded Shoulders;Decreased lumbar lordosis;Posterior pelvic tilt   Lumbar Exercises: Stretches   Active Hamstring Stretch 3 reps;20 seconds  bil with green strap, while on HMP   Lower Trunk Rotation 3 reps;20 seconds  bil. needs reminders for positioning and technique, On HMP   Piriformis Stretch 3 reps;20 seconds  in supine, needs mod vc's for technique, on HMP   Lumbar Exercises: Supine   Bridge 10 reps  on HMP   Other Supine Lumbar Exercises shoulder  flexion 2x10x alternating, with abdominal contraction   Modalities   Modalities Moist Heat;Electrical Stimulation   Moist Heat Therapy   Number Minutes Moist Heat 15 Minutes   Moist Heat Location Lumbar Spine  supine   Electrical Stimulation   Electrical Stimulation Location lumbar prone   Electrical Stimulation Action IFC   Electrical Stimulation Parameters to patient   Electrical Stimulation Goals Pain                  PT Short Term Goals - 08/19/15 1139    PT SHORT TERM GOAL #1   Title independent with initial HEP for flexibility   Time 4   Period Weeks   Status On-going   PT SHORT TERM GOAL #2   Title understand correct posture to decrease strain on her spine   Time 4   Period Weeks   Status On-going   PT SHORT TERM GOAL #3   Title understand how to perform abdominal massage to reduce constipation and strain on lumbar   Time 4   Period Weeks   Status On-going   PT SHORT TERM GOAL #4   Title understand how to toilet correctly while having a bowel movement to reduce strain on lumbar   Time 4   Period Weeks   Status On-going  PT Long Term Goals - 08/19/15 1155    PT LONG TERM GOAL #1   Title independent with HEP and understands how to progress herself   Time 8   Period Weeks   Status On-going   PT LONG TERM GOAL #2   Title understand correct body mechanics with daily tasks to reduce strain on lumbar spine   Time 8   Period Weeks   Status On-going   PT LONG TERM GOAL #3   Title understand ways to manage her back pain using a home TENS unit   Time 8   Period Weeks   Status On-going               Plan - 08/19/15 1137    Clinical Impression Statement Patient is a 64 y.o. female with diagnosis of chronic low back pain. Pt needs mod re-dirction for good technique of exercises. Pt will continue to benefit from skilled PT to improve strength    Pt will benefit from skilled therapeutic intervention in order to improve on the following  deficits Decreased mobility;Decreased strength;Pain;Increased muscle spasms;Increased fascial restricitons;Decreased range of motion;Decreased endurance;Decreased activity tolerance   Rehab Potential Good   PT Frequency 2x / week   PT Duration 8 weeks   PT Treatment/Interventions Cryotherapy;Electrical Stimulation;Moist Heat;Therapeutic exercise;Therapeutic activities;Ultrasound;Manual techniques;Patient/family education;Neuromuscular re-education;Passive range of motion;Other (comment)   PT Next Visit Plan body mechanics, abdominal massage, continue stretching and gentle strengthening   Consulted and Agree with Plan of Care Patient        Problem List There are no active problems to display for this patient.   Moss,Jessica Sumler PTA 08/19/2015, 1:02 PM  [facility] [address] [place], [place], [address] Phone: [phone]   Fax:  [phone]  Name: Jessica Moss MRN: [id number] Date of Birth: 1951-08-14

## 2015-08-26 ENCOUNTER — Encounter: Payer: Self-pay | Admitting: Physical Therapy

## 2015-08-26 ENCOUNTER — Ambulatory Visit: Payer: 59 | Admitting: Physical Therapy

## 2015-08-26 DIAGNOSIS — M79604 Pain in right leg: Secondary | ICD-10-CM

## 2015-08-26 DIAGNOSIS — M545 Low back pain: Secondary | ICD-10-CM | POA: Diagnosis not present

## 2015-08-26 NOTE — Therapy (Signed)
[facility] [address] [place], [place], [address] Phone: [phone]   Fax:  [phone]  Physical Therapy Treatment  Patient Details  Name: Jessica Moss MRN: [id number] Date of Birth: [birth year] Referring Provider: Dr. Darcus Austin  Encounter Date: 08/26/2015      PT End of Session - 08/26/15 1031    Visit Number 4   Date for PT Re-Evaluation 10/02/15   PT Start Time 1025  Pt was worked in   [employer] Stop Time 1115   PT Time Calculation (min) 50 min   Activity Tolerance Patient tolerated treatment well   Behavior During Therapy [facility] for tasks assessed/performed      Past Medical History  Diagnosis Date  . Anxiety   . Diabetes mellitus without complication ([place])   . Hyperlipidemia   . Hypertension     Past Surgical History  Procedure Laterality Date  . Abdominal hysterectomy    . Spine surgery      cervical and lumbar    There were no vitals filed for this visit.  Visit Diagnosis:  Lumbar pain with radiation down both legs      Subjective Assessment - 08/26/15 1030    Subjective Was on vacation for holiday so she did not do her HEP. She did walk. She did not have a lot of leg pain, just some back discomfort.    Currently in Pain? Yes   Pain Score 2    Pain Location Back   Pain Orientation Lower   Pain Descriptors / Indicators Discomfort   Aggravating Factors  Been on going   Pain Relieving Factors Meds   Multiple Pain Sites No                         [facility] Adult PT Treatment/Exercise - 08/26/15 0001    Lumbar Exercises: Stretches   Active Hamstring Stretch 3 reps;20 seconds  bil with green strap, while on HMP   Single Knee to Chest Stretch 3 reps;20 seconds  Bil   Piriformis Stretch 2 reps;30 seconds   Lumbar Exercises: Supine   Ab Set --  TA contraction in sitting 10 min   Bridge 20 reps   Moist Heat Therapy   Number Minutes Moist Heat 15 Minutes   Moist Heat  Location Lumbar Spine  supine   Electrical Stimulation   Electrical Stimulation Location lumbar prone   Electrical Stimulation Action IFC   Electrical Stimulation Goals Pain                PT Education - 08/26/15 1055    Education provided Yes   Education Details TA contraction/education   Person(s) Educated Patient   Methods Explanation;Demonstration;Handout;Verbal cues   Comprehension Verbalized understanding;Returned demonstration          PT Short Term Goals - 08/26/15 1037    PT SHORT TERM GOAL #1   Title independent with initial HEP for flexibility   Time 4   Period Weeks   Status Achieved           PT Long Term Goals - 08/19/15 1155    PT LONG TERM GOAL #1   Title independent with HEP and understands how to progress herself   Time 8   Period Weeks   Status On-going   PT LONG TERM GOAL #2   Title understand correct body mechanics with daily tasks to reduce strain on lumbar spine   Time 8  Period Weeks   Status On-going   PT LONG TERM GOAL #3   Title understand ways to manage her back pain using a home TENS unit   Time 8   Period Weeks   Status On-going               Plan - 08/26/15 1038    Clinical Impression Statement Pt was out of town for Thanksgiving and did not do any HEP per her report. She reports she used a TENS- like machine whle on holiday which helped a lot. 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  No new goals were met.    Pt will benefit from skilled therapeutic intervention in order to improve on the following deficits Decreased mobility;Decreased strength;Pain;Increased muscle spasms;Increased fascial restricitons;Decreased range of motion;Decreased endurance;Decreased activity tolerance   Rehab Potential Good   Clinical Impairments Affecting Rehab Potential None   PT Frequency 2x / week   PT Duration 8 weeks   PT Treatment/Interventions Cryotherapy;Electrical  Stimulation;Moist Heat;Therapeutic exercise;Therapeutic activities;Ultrasound;Manual techniques;Patient/family education;Neuromuscular re-education;Passive range of motion;Other (comment)   PT Next Visit 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    Consulted and Agree with Plan of Care Patient        Problem List There are no active problems to display for this patient.   COCHRAN,JENNIFER, PTA 08/26/2015, 10:57 AM  [facility] [address], [address] [place], [place], [address] Phone: [phone]   Fax:  [phone]  Name: Jessica Moss MRN: [id number] Date of Birth: 10/08/1950     

## 2015-08-26 NOTE — Patient Instructions (Signed)

## 2015-09-03 ENCOUNTER — Ambulatory Visit: Payer: 59 | Attending: Family Medicine | Admitting: Physical Therapy

## 2015-09-03 DIAGNOSIS — M79604 Pain in right leg: Secondary | ICD-10-CM

## 2015-09-03 DIAGNOSIS — M545 Low back pain: Secondary | ICD-10-CM | POA: Insufficient documentation

## 2015-09-03 NOTE — Therapy (Signed)
[facility] [address] [place], [place], [address] Phone: [phone]   Fax:  [phone]  Physical Therapy Treatment  Patient Details  Name: Lawrence MarseillesJean C Yuille MRN: [id number] Date of Birth: 07/20/1951 Referring Provider: Dr. Shaune Pollackonna Gates  Encounter Date: 09/03/2015      PT End of Session - 09/03/15 1053    Visit Number 5   Date for PT Re-Evaluation 10/02/15   PT Start Time 1018   PT Stop Time 1108   PT Time Calculation (min) 50 min   Activity Tolerance Patient tolerated treatment well   Behavior During Therapy [facility] for tasks assessed/performed      Past Medical History  Diagnosis Date  . Anxiety   . Diabetes mellitus without complication (HCC)   . Hyperlipidemia   . Hypertension     Past Surgical History  Procedure Laterality Date  . Abdominal hysterectomy    . Spine surgery      cervical and lumbar    There were no vitals filed for this visit.  Visit Diagnosis:  Lumbar pain with radiation down both legs      Subjective Assessment - 09/03/15 1027    Subjective I got the flu shot and got a cold, so I have not been feeling well and have not done my exercises   Currently in Pain? Yes   Pain Score 2    Pain Location Back   Pain Orientation Lower   Pain Descriptors / Indicators Discomfort                         [facility] Adult PT Treatment/Exercise - 09/03/15 0001    Lumbar Exercises: Stretches   Active Hamstring Stretch 2 reps;30 seconds  seated   Single Knee to Chest Stretch 2 reps;30 seconds   Lower Trunk Rotation --  x 60 seconds   Piriformis Stretch 2 reps;30 seconds   Lumbar Exercises: Aerobic   Stationary Bike level 2 x 5 mins   Lumbar Exercises: Supine   Ab Set --  TA contract with leg out/in and marching   Bridge 20 reps                PT Education - 09/03/15 1053    Education Details TA with LE movements   Person(s) Educated Patient   Methods  Explanation;Demonstration;Handout   Comprehension Returned demonstration;Verbalized understanding          PT Short Term Goals - 09/03/15 1055    PT SHORT TERM GOAL #1   Title independent with initial HEP for flexibility   Time 4   Period Weeks   Status Achieved   PT SHORT TERM GOAL #2   Title understand correct posture to decrease strain on her spine   Time 4   Period Weeks   Status On-going   PT SHORT TERM GOAL #3   Title understand how to perform abdominal massage to reduce constipation and strain on lumbar   Time 4   Period Weeks   Status On-going           PT Long Term Goals - 09/03/15 1055    PT LONG TERM GOAL #1   Title independent with HEP and understands how to progress herself   Period Weeks   Status On-going   PT LONG TERM GOAL #2   Title understand correct body mechanics with daily tasks to reduce strain on lumbar spine   Time 8   Period Weeks  Status On-going   PT LONG TERM GOAL #3   Title understand ways to manage her back pain using a home TENS unit   Time 8   Period Weeks   Status On-going               Plan - 09/03/15 1054    Clinical Impression Statement Pt requires tactile and verbal cues for technique with TA contraction exercises.  Will benefit from practice and repetition.   Pt will benefit from skilled therapeutic intervention in order to improve on the following deficits Decreased mobility;Decreased strength;Pain;Increased muscle spasms;Increased fascial restricitons;Decreased range of motion;Decreased endurance;Decreased activity tolerance   PT Frequency 2x / week   PT Duration 8 weeks   PT Treatment/Interventions Cryotherapy;Electrical Stimulation;Moist Heat;Therapeutic exercise;Therapeutic activities;Ultrasound;Manual techniques;Patient/family education;Neuromuscular re-education;Passive range of motion;Other (comment)   PT Next Visit Plan assess TA therex, abdominal massage and toileting techniques        Problem  List There are no active problems to display for this patient.   Reggy Eye, PT, DPT  09/03/2015, 10:56 AM  [facility] [address] [place], [place], [address] Phone: [phone]   Fax:  [phone]  Name: AMARRIA ANDREASEN MRN: [id number] Date of Birth: 1951-08-10

## 2015-09-05 ENCOUNTER — Ambulatory Visit: Payer: 59 | Admitting: Physical Therapy

## 2015-09-05 DIAGNOSIS — M545 Low back pain: Secondary | ICD-10-CM | POA: Diagnosis not present

## 2015-09-05 DIAGNOSIS — M79605 Pain in left leg: Secondary | ICD-10-CM

## 2015-09-05 NOTE — Therapy (Addendum)
[facility] [address] [place], [place], [address] Phone: [phone]   Fax:  [phone]  Physical Therapy Treatment  Patient Details  Name: Jessica Moss MRN: [id number] Date of Birth: [birth year] Referring Provider: Dr. Darcus Austin  Encounter Date: 09/05/2015      PT End of Session - 09/05/15 1041    Visit Number 6   Date for PT Re-Evaluation 10/02/15   PT Start Time 5945   PT Stop Time 1105   PT Time Calculation (min) 50 min   Activity Tolerance Patient tolerated treatment well   Behavior During Therapy [facility] for tasks assessed/performed      Past Medical History  Diagnosis Date  . Anxiety   . Diabetes mellitus without complication ([facility])   . Hyperlipidemia   . Hypertension     Past Surgical History  Procedure Laterality Date  . Abdominal hysterectomy    . Spine surgery      cervical and lumbar    There were no vitals filed for this visit.  Visit Diagnosis:  Lumbar pain with radiation down both legs      Subjective Assessment - 09/05/15 1025    Subjective Pt states she did a lot of walking doing christmas shopping yesterday, had lots of spasms last night   Currently in Pain? Yes   Pain Score 2    Pain Location Back   Pain Orientation Lower   Pain Descriptors / Indicators Discomfort   Pain Type Chronic pain   Pain Onset More than a month ago                         [facility] Adult PT Treatment/Exercise - 09/05/15 0001    Lumbar Exercises: Stretches   Active Hamstring Stretch 2 reps;30 seconds  seated   Single Knee to Chest Stretch 2 reps;30 seconds   Lower Trunk Rotation 2 reps;30 seconds   Piriformis Stretch 2 reps;30 seconds  seated   Lumbar Exercises: Supine   Ab Set 10 reps;5 seconds  then with hip out/in,tactile cues for technique and breath   Bridge 20 reps   Modalities   Modalities Electrical Stimulation;Moist Heat   Moist Heat Therapy   Number Minutes Moist  Heat 15 Minutes   Moist Heat Location Lumbar Spine   Electrical Stimulation   Electrical Stimulation Location lumbar   Electrical Stimulation Action IFC   Electrical Stimulation Goals Pain                PT Education - 09/05/15 1051    Education provided Yes   Education Details breathing technique with TA   Person(s) Educated Patient   Methods Explanation;Demonstration   Comprehension Verbalized understanding;Returned demonstration          PT Short Term Goals - 09/05/15 1043    PT SHORT TERM GOAL #1   Title independent with initial HEP for flexibility   Time 4   Period Weeks   Status Achieved   PT SHORT TERM GOAL #2   Title understand correct posture to decrease strain on her spine   Time 4   Period Weeks   Status On-going   PT SHORT TERM GOAL #3   Title understand how to perform abdominal massage to reduce constipation and strain on lumbar   Time 4   Period Weeks   Status On-going           PT Long Term Goals - 09/05/15 1043  PT LONG TERM GOAL #1   Title independent with HEP and understands how to progress herself   Time 8   Period Weeks   Status On-going   PT LONG TERM GOAL #2   Title understand correct body mechanics with daily tasks to reduce strain on lumbar spine   Time 8   Period Weeks   Status On-going   PT LONG TERM GOAL #3   Title understand ways to manage her back pain using a home TENS unit   Time 8   Period Weeks   Status On-going               Plan - 09/05/15 1042    Clinical Impression Statement Pt still with difficulty with breathing and TA contraction exercises, continue to practice in sessions and encourage consistent practice at home.   Pt will benefit from skilled therapeutic intervention in order to improve on the following deficits Decreased mobility;Decreased strength;Pain;Increased muscle spasms;Increased fascial restricitons;Decreased range of motion;Decreased endurance;Decreased activity tolerance   PT  Frequency 2x / week   PT Duration 8 weeks   PT Treatment/Interventions Cryotherapy;Electrical Stimulation;Moist Heat;Therapeutic exercise;Therapeutic activities;Ultrasound;Manual techniques;Patient/family education;Neuromuscular re-education;Passive range of motion;Other (comment)   PT Next Visit Plan continue TA therex, abdominal massage, toileting techniques   Consulted and Agree with Plan of Care Patient        Problem List There are no active problems to display for this patient.   Isabelle Course, PT, DPT  09/05/2015, 10:52 AM  [facility] [address] [place], [place], [address] Phone: [phone]   Fax:  [phone]  Name: Jessica Moss MRN: [id number] Date of Birth: 12/12/1950    PHYSICAL THERAPY DISCHARGE SUMMARY  Visits from Start of Care: 6 Current functional level related to goals / functional outcomes: See above. Patient did not return therefore not able to assess.    Remaining deficits: See above.    Education / Equipment: HEP  Plan: Patient agrees to discharge.  Patient goals were not met. Patient is being discharged due to not returning since the last visit.  Thank you for the referral. Earlie Counts, PT 10/01/2015 4:13 PM  ?????

## 2016-09-30 ENCOUNTER — Other Ambulatory Visit: Payer: Self-pay | Admitting: Family Medicine

## 2016-09-30 DIAGNOSIS — Z1231 Encounter for screening mammogram for malignant neoplasm of breast: Secondary | ICD-10-CM

## 2016-10-08 ENCOUNTER — Ambulatory Visit: Payer: Self-pay

## 2016-10-12 ENCOUNTER — Ambulatory Visit
Admission: RE | Admit: 2016-10-12 | Discharge: 2016-10-12 | Disposition: A | Discharge status: Home / Self Care | Payer: BLUE CROSS/BLUE SHIELD | Source: Ambulatory Visit | Attending: Family Medicine | Admitting: Family Medicine

## 2016-10-12 DIAGNOSIS — Z1231 Encounter for screening mammogram for malignant neoplasm of breast: Secondary | ICD-10-CM

## 2016-11-13 ENCOUNTER — Other Ambulatory Visit (HOSPITAL_COMMUNITY): Payer: Self-pay | Admitting: Internal Medicine

## 2016-11-13 DIAGNOSIS — R000 Tachycardia, unspecified: Secondary | ICD-10-CM

## 2016-11-24 ENCOUNTER — Ambulatory Visit (HOSPITAL_COMMUNITY)
Admission: RE | Admit: 2016-11-24 | Discharge: 2016-11-24 | Disposition: A | Discharge status: Home / Self Care | Payer: BLUE CROSS/BLUE SHIELD | Source: Ambulatory Visit | Attending: Internal Medicine | Admitting: Internal Medicine

## 2016-11-24 DIAGNOSIS — R000 Tachycardia, unspecified: Secondary | ICD-10-CM | POA: Diagnosis not present

## 2016-11-24 LAB — ECHOCARDIOGRAM COMPLETE
AVLVOTPG: 4 mmHg
E/e' ratio: 6.78
FS: 35 % (ref 28–44)
IVS/LV PW RATIO, ED: 0.77
LA ID, A-P, ES: 27 mm
LA vol: 24.1 mL
LAVOLA4C: 23 mL
LEFT ATRIUM END SYS DIAM: 27 mm
LV PW d: 8.86 mm — AB (ref 0.6–1.1)
LV TDI E'LATERAL: 7.94
LV TDI E'MEDIAL: 8.27
LVEEAVG: 6.78
LVEEMED: 6.78
LVELAT: 7.94 cm/s
LVOT SV: 55 mL
LVOT VTI: 19.5 cm
LVOT area: 2.84 cm2
LVOT diameter: 19 mm
LVOTPV: 106 cm/s
Lateral S' vel: 14.5 cm/s
MV pk E vel: 53.8 m/s
MVPKAVEL: 73.2 m/s
TAPSE: 17.9 mm

## 2016-11-24 NOTE — Progress Notes (Signed)
  Echocardiogram 2D Echocardiogram has been performed.  Jessica Moss, Jessica Moss 11/24/2016, 10:41 AM
